# Patient Record
Sex: Female | Born: 1950 | Race: White | Hispanic: No | Marital: Married
Health system: Southern US, Community
[De-identification: ages and names within clinical notes are randomized; demographics above are authoritative.]

---

## 2004-10-15 ENCOUNTER — Emergency Department: Payer: Self-pay | Admitting: Emergency Medicine

## 2006-06-09 IMAGING — CR DG CHEST 2V
1 series · 2 of 2 positions shown · non-contrast
Comparison: none

REASON FOR EXAM: weakness
COMMENTS:

[Series 1: view not recorded · 0.17mm/px · 2 of 2 slices shown]
[im 1/2]
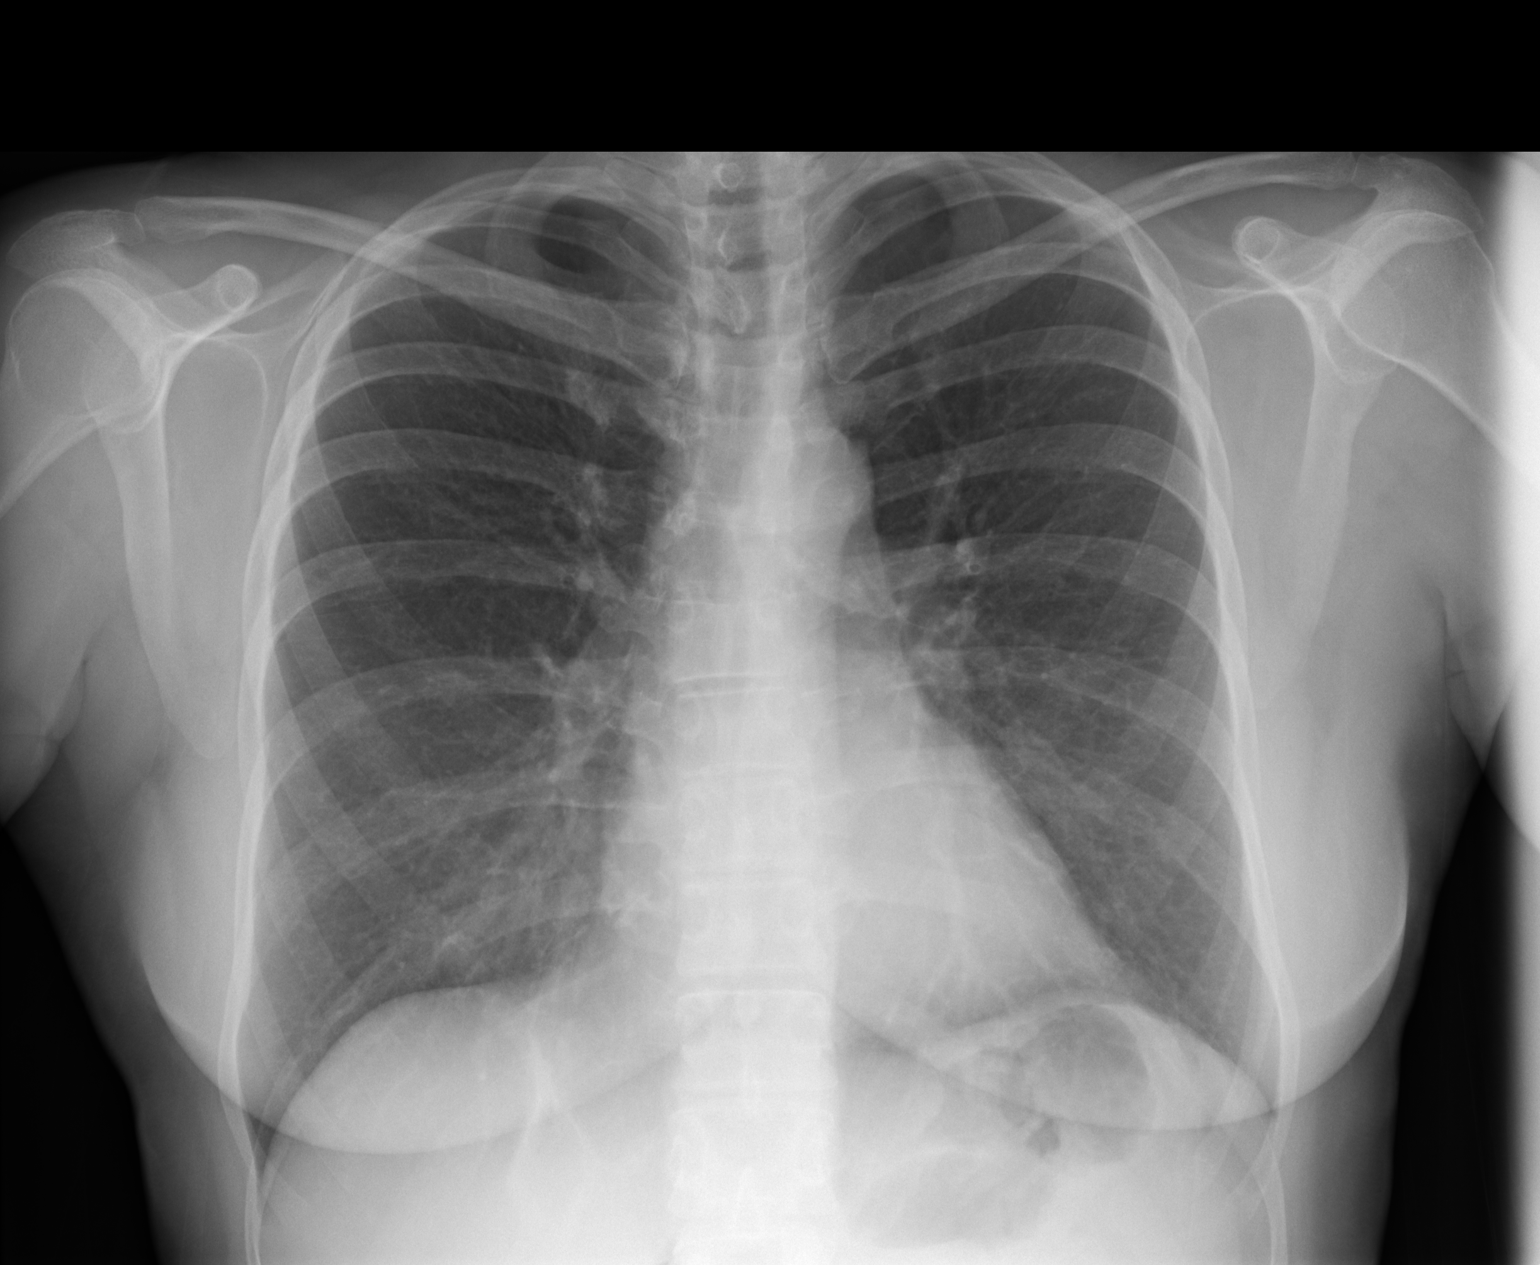
[im 2/2]
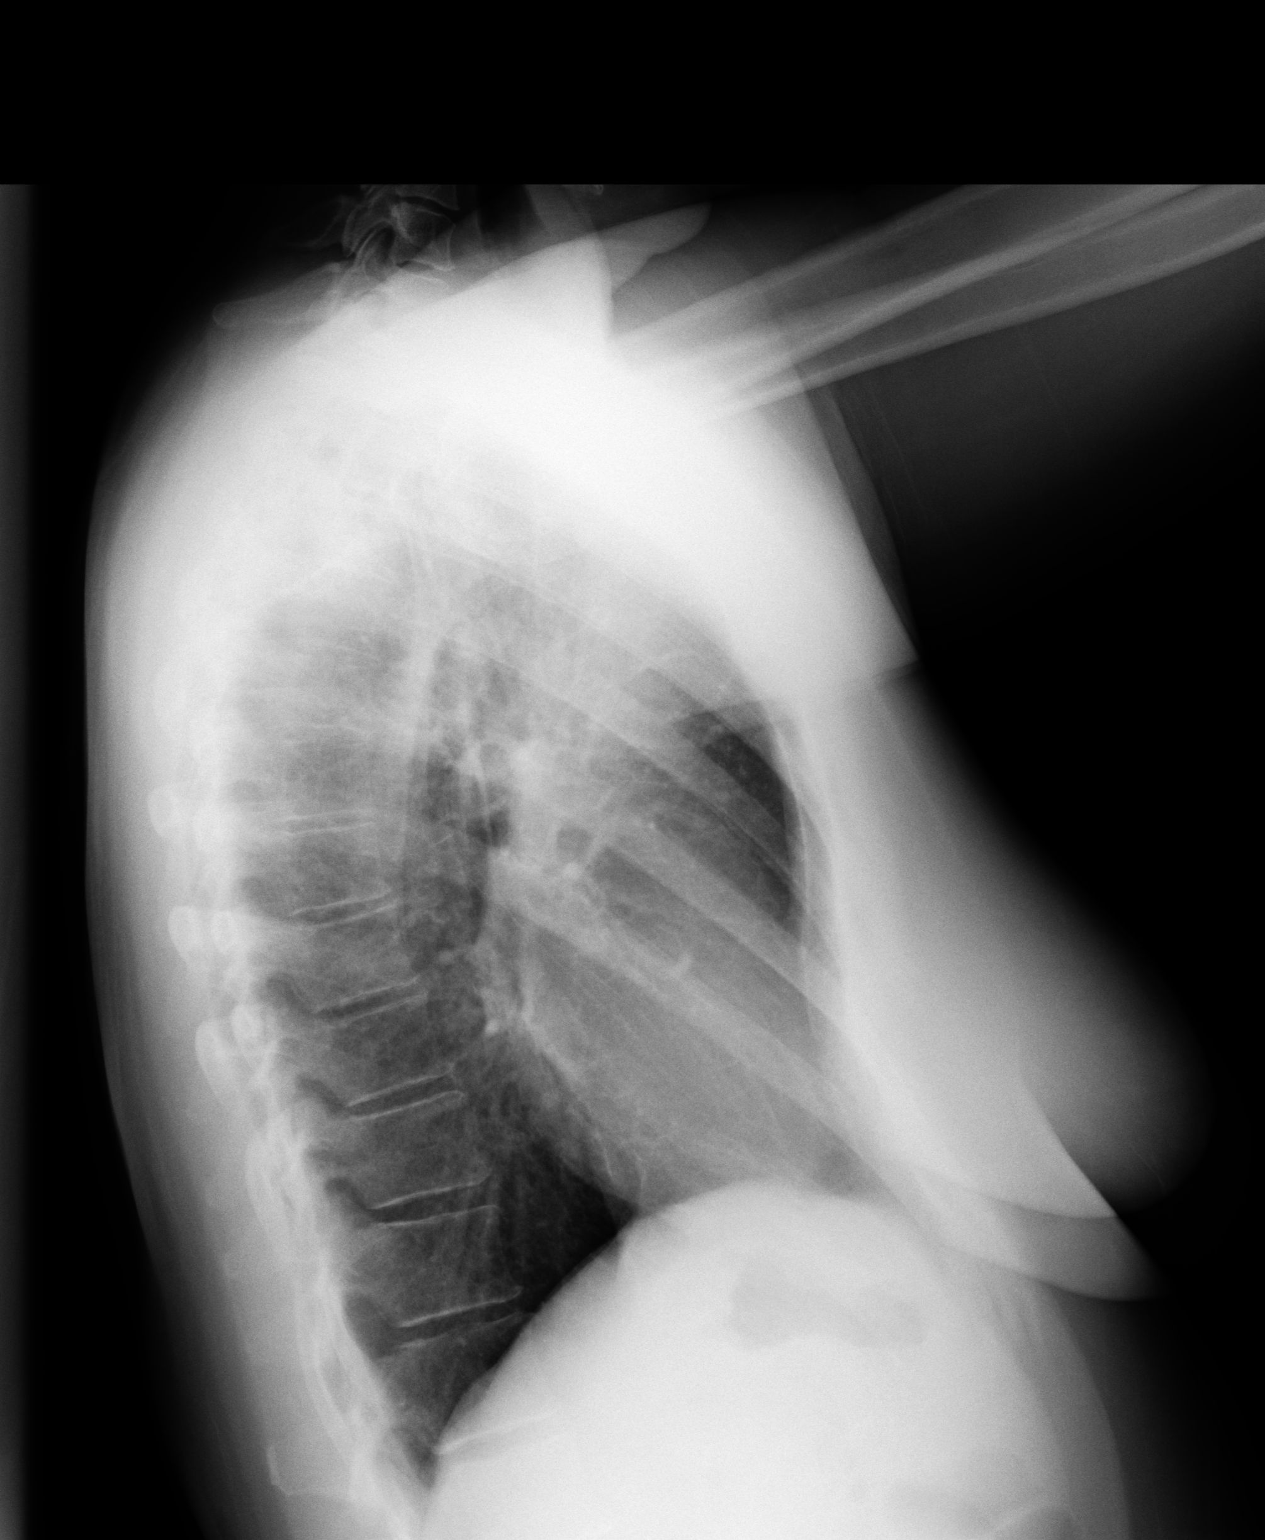

[2 of 2 positions shown; findings below may reference images not displayed]

PROCEDURE:     DXR - DXR CHEST PA (OR AP) AND LATERAL  - October 15, 2004 [DATE]

RESULT:     PA and lateral view reveals the heart to be within normal limits
and size.  The lung fields are clear. Vascularity is within normal limits.
No effusions are noted. Incidentally noted is a mild pectus excavatum.
IMPRESSION: 1)Lung fields are clear. Mild pectus excavatum noted.

## 2019-01-04 ENCOUNTER — Telehealth: Payer: Self-pay | Admitting: *Deleted

## 2019-01-04 DIAGNOSIS — Z20822 Contact with and (suspected) exposure to covid-19: Secondary | ICD-10-CM

## 2019-01-04 NOTE — Telephone Encounter (Signed)
Left message for pt to CB for covid testing. Number provided by HD not in service.  Left message at  [phone]    Two other family members to be tested: Peri Maris  02/23/1969    Belva Crome 08/06/2002. Same household.   Requested by [facility] Dept.  Possible exposure.

## 2019-01-04 NOTE — Telephone Encounter (Signed)
Pt scheduled for testing at [facility] site tomorrow 01/05/2019.  Pt with possible exposure. Requested by [facility] Dept.  Pts CB# [phone]  Testing process reviewed, wear mask, stay in car; verbalizes understanding.

## 2019-01-05 ENCOUNTER — Other Ambulatory Visit: Payer: Self-pay

## 2019-01-05 DIAGNOSIS — Z20822 Contact with and (suspected) exposure to covid-19: Secondary | ICD-10-CM

## 2019-01-09 LAB — NOVEL CORONAVIRUS, NAA: SARS-CoV-2, NAA: NOT DETECTED

## 2019-11-15 ENCOUNTER — Other Ambulatory Visit: Payer: Self-pay | Admitting: Obstetrics and Gynecology

## 2019-11-15 DIAGNOSIS — R600 Localized edema: Secondary | ICD-10-CM

## 2019-11-15 DIAGNOSIS — Z9889 Other specified postprocedural states: Secondary | ICD-10-CM

## 2019-11-25 ENCOUNTER — Ambulatory Visit
Admission: RE | Admit: 2019-11-25 | Discharge: 2019-11-25 | Disposition: A | Discharge status: Home / Self Care | Payer: Medicare Other | Source: Ambulatory Visit | Attending: Obstetrics and Gynecology | Admitting: Obstetrics and Gynecology

## 2019-11-25 ENCOUNTER — Other Ambulatory Visit: Payer: Self-pay

## 2019-11-25 DIAGNOSIS — Z9889 Other specified postprocedural states: Secondary | ICD-10-CM | POA: Insufficient documentation

## 2019-11-25 DIAGNOSIS — R600 Localized edema: Secondary | ICD-10-CM | POA: Diagnosis present

## 2021-07-19 IMAGING — US US EXTREM LOW VENOUS*L*
1 series · 13 of 24 positions shown · non-contrast
Comparison: None.

CLINICAL DATA: Left lower extremity pain and edema. Recent
hysterectomy. Evaluate for DVT.



[Series 1: us extrem low venous*left* · 0.08mm/px · 13 of 31 slices shown]
[im 1/31]
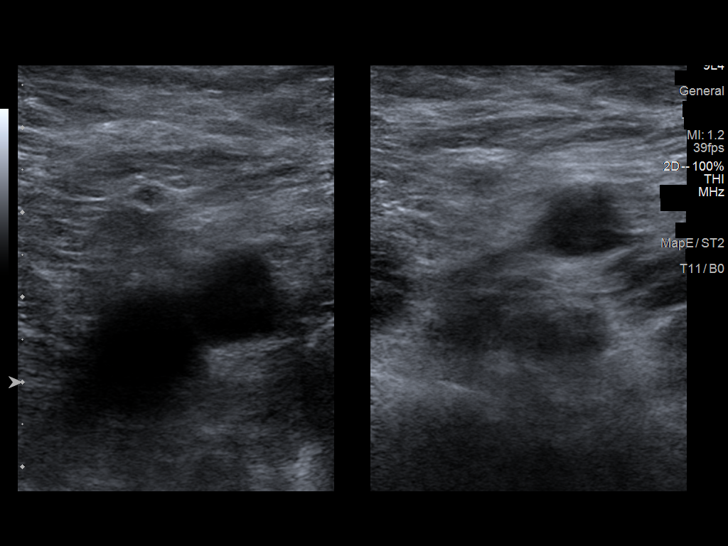
[im 3/31]
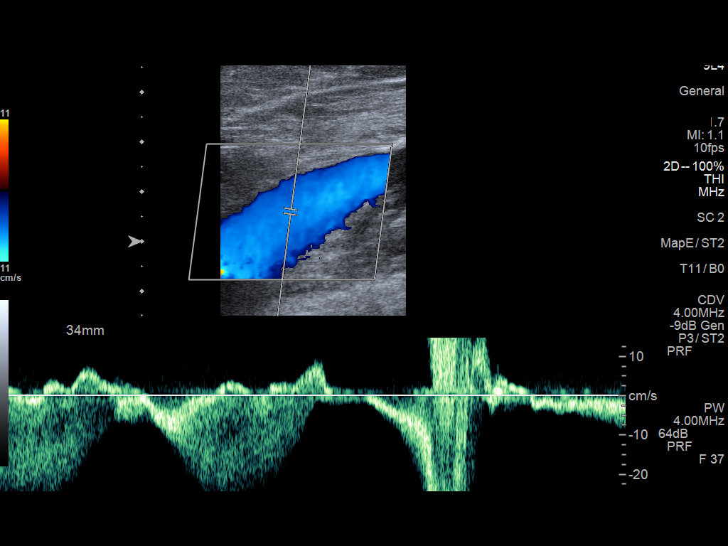
[im 6/31]
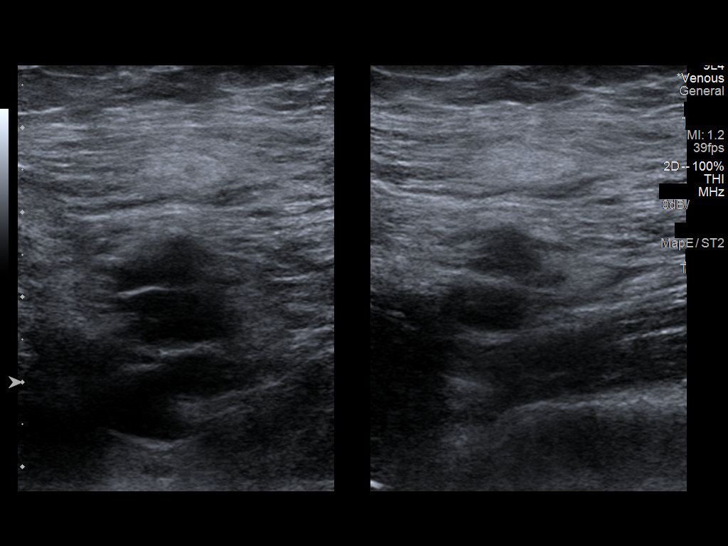
[im 8/31]
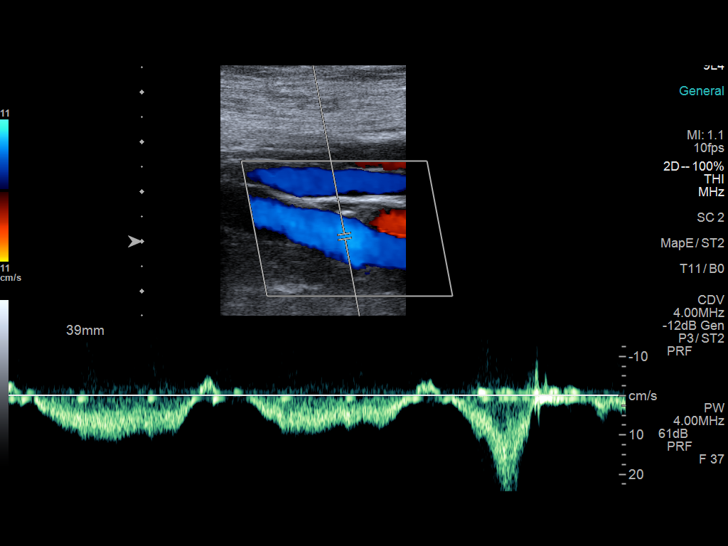
[im 11/31]
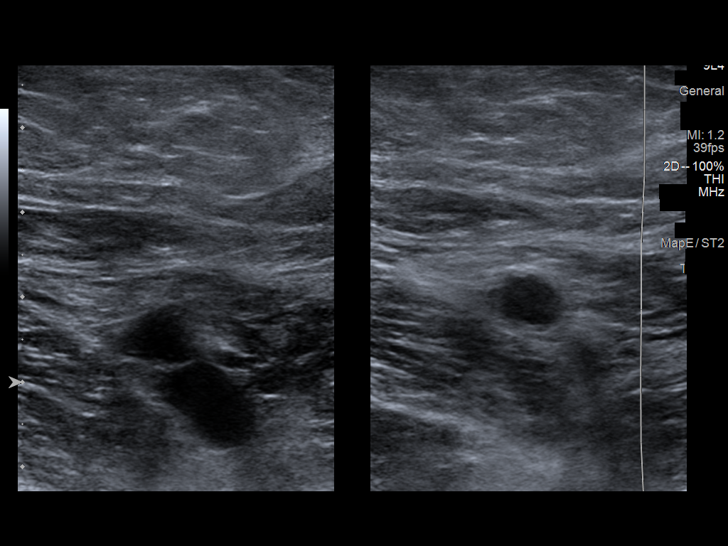
[im 14/31]
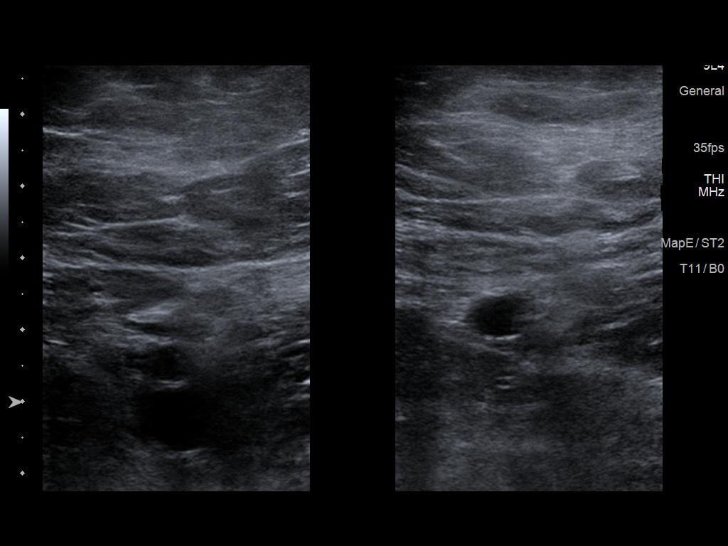
[im 16/31]
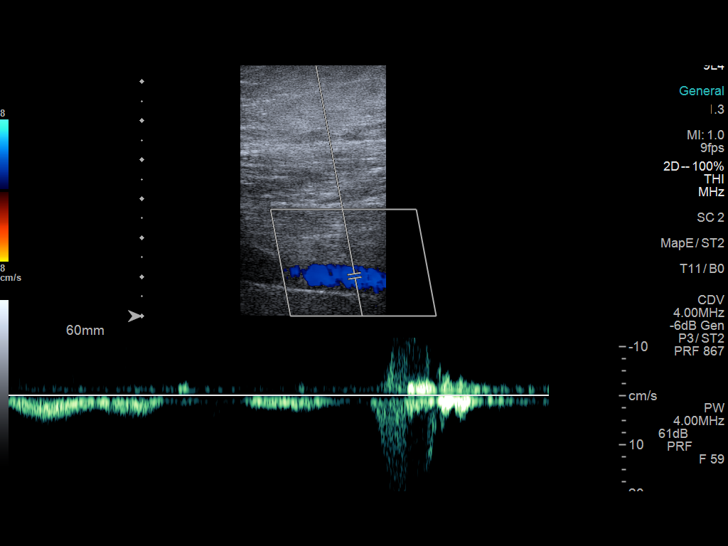
[im 17/31]
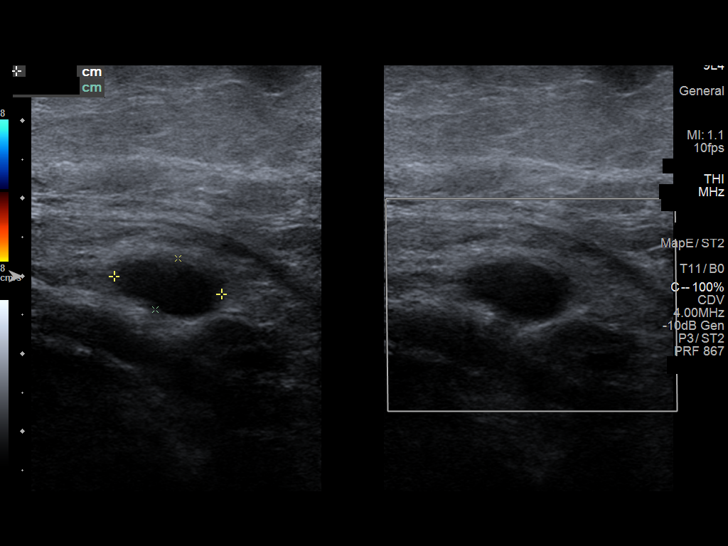
[im 20/31]
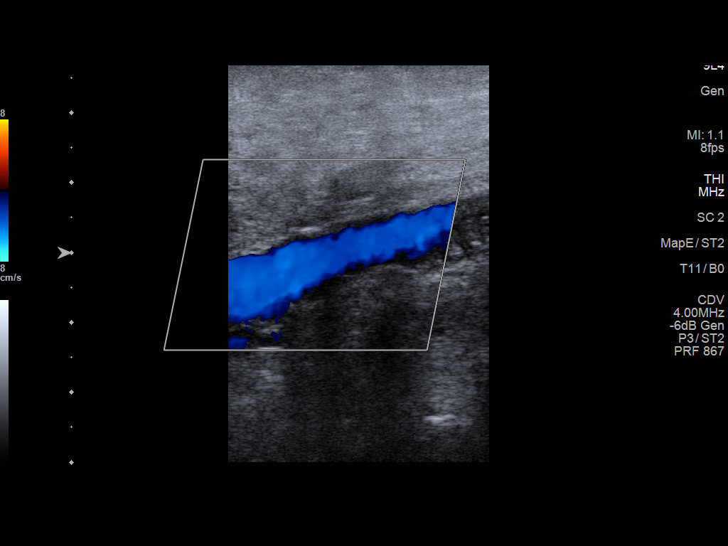
[im 23/31]
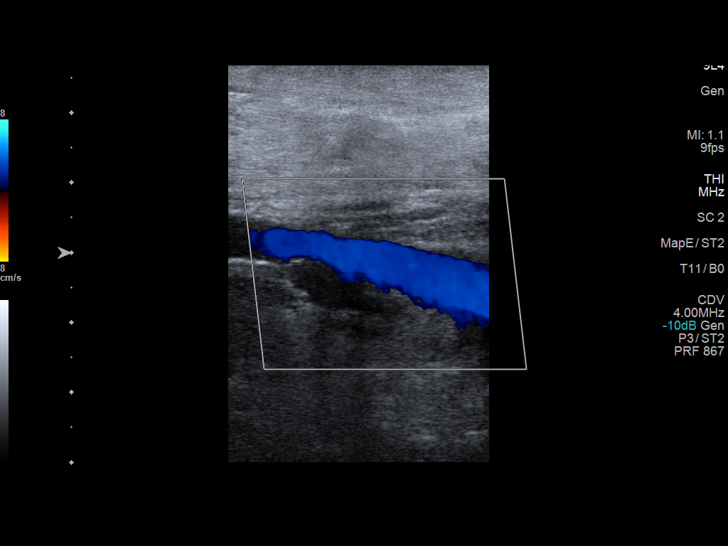
[im 25/31]
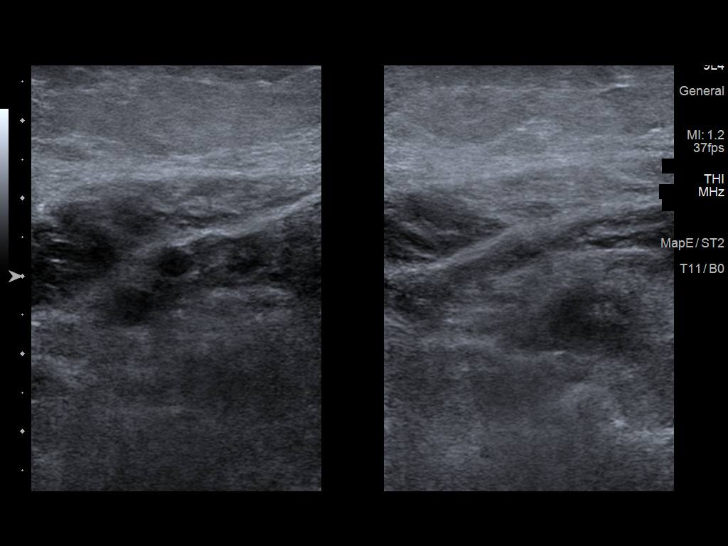
[im 28/31]
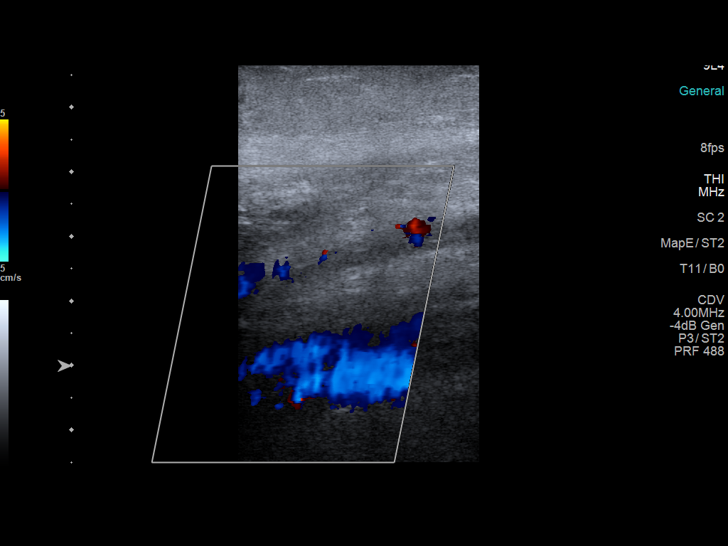
[im 31/31]
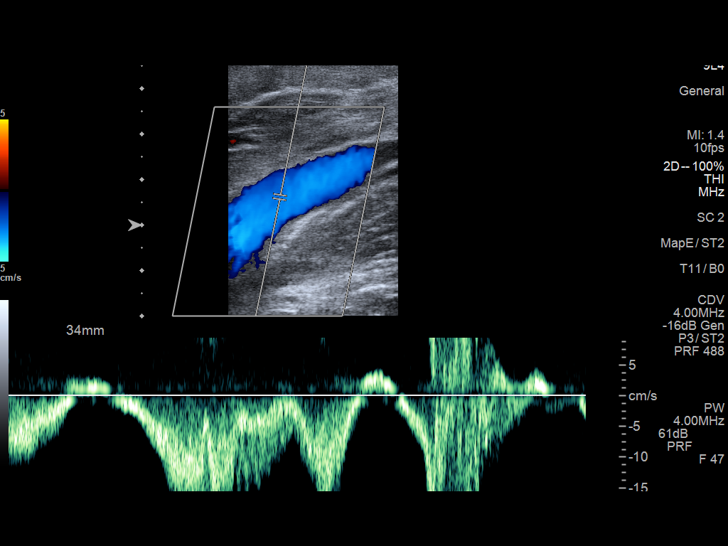

[13 of 24 positions shown; findings below may reference images not displayed]

FINDINGS: Contralateral Common Femoral Vein: Respiratory phasicity is normal
and symmetric with the symptomatic side. No evidence of thrombus.
Normal compressibility.

Common Femoral Vein: No evidence of thrombus. Normal
compressibility, respiratory phasicity and response to augmentation.

Saphenofemoral Junction: No evidence of thrombus. Normal
compressibility and flow on color Doppler imaging.

Profunda Femoral Vein: No evidence of thrombus. Normal
compressibility and flow on color Doppler imaging.

Femoral Vein: No evidence of thrombus. Normal compressibility,
respiratory phasicity and response to augmentation.

Popliteal Vein: No evidence of thrombus. Normal compressibility,
respiratory phasicity and response to augmentation.

Calf Veins: No evidence of thrombus. Normal compressibility and flow
on color Doppler imaging.

Superficial Great Saphenous Vein: No evidence of thrombus. Normal
compressibility.

Venous Reflux:  None.

Other Findings: Note is made of an approximately 1.4 x 0.7 x 1.7 cm
anechoic fluid collection with the left popliteal fossa compatible
with a Baker's cyst.
IMPRESSION: 1. No evidence of DVT within the left lower extremity.
2. Note is made of a tiny (approximately 1.4 cm) left-sided Baker's
cyst.
